# Patient Record
Sex: Female | Born: 2000 | Race: White | Hispanic: No | Marital: Single | Smoking: Never smoker
Health system: Southern US, Community
[De-identification: ages and names within clinical notes are randomized; demographics above are authoritative.]

---
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
## 2022-10-14 IMAGING — CT CT ABD-PELV W/ CM
2 of 4 series · 16 of 46 positions shown, 18 images · IV contrast (OMNIPAQUE 350)
Comparison: None.

CLINICAL DATA: Abdominal pain, fever. Concern for appendicitis
versus pyelonephritis versus renal stone.

EXAM:
CT ABDOMEN AND PELVIS WITH CONTRAST
TECHNIQUE: Multidetector CT imaging of the abdomen and pelvis was performed
using the standard protocol following bolus administration of
intravenous contrast.
CONTRAST:  75mL OMNIPAQUE IOHEXOL 350 MG/ML SOLN

[Series 2: axial st · axial · 0.64mm/px · z∈[+1118,+1483]mm · 13 of 83 slices shown, 15 images]
[im 5/83  soft-tissue]
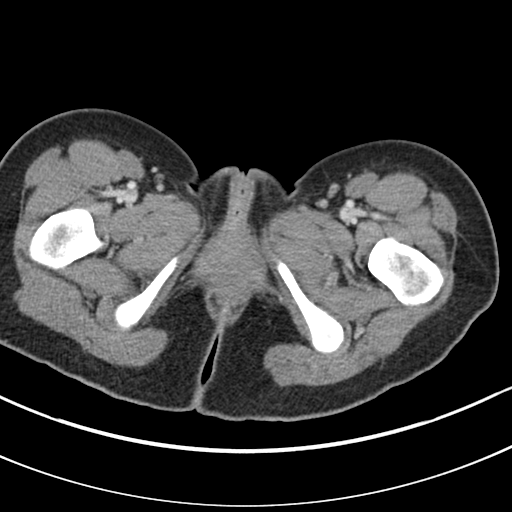
[im 5/83  bone]
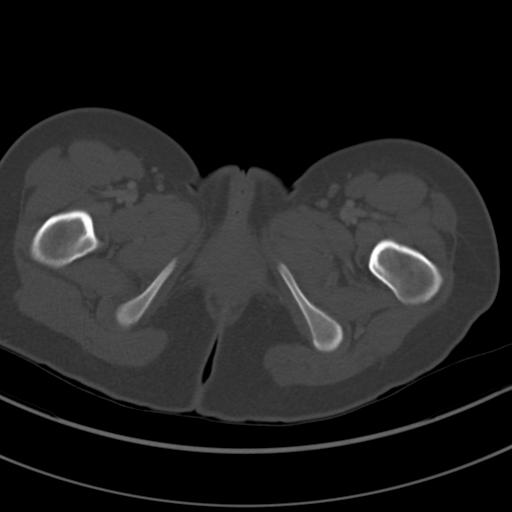
[im 13/83  soft-tissue]
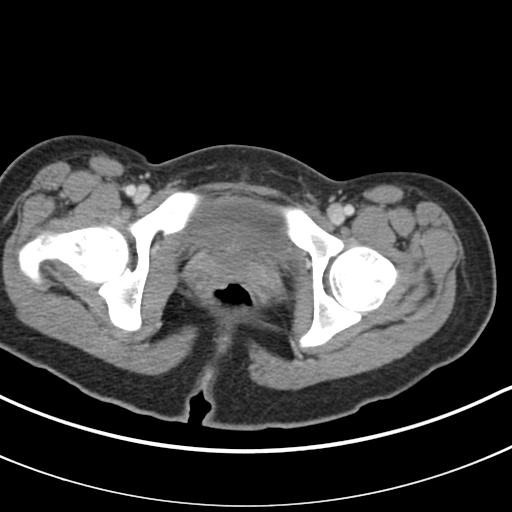
[im 18/83  soft-tissue]
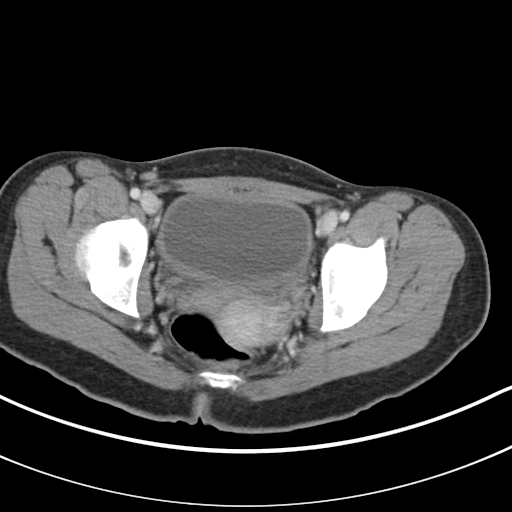
[im 22/83  soft-tissue]
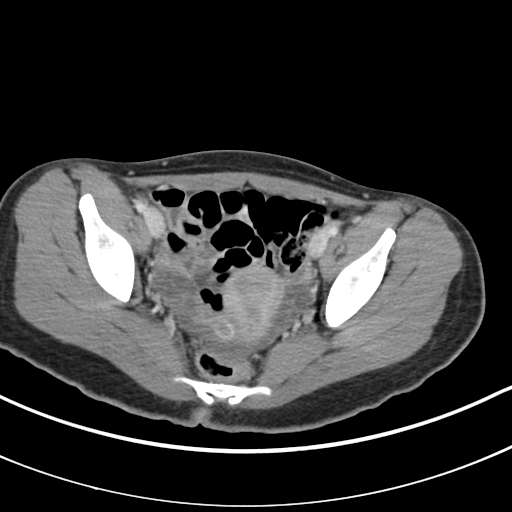
[im 31/83  soft-tissue]
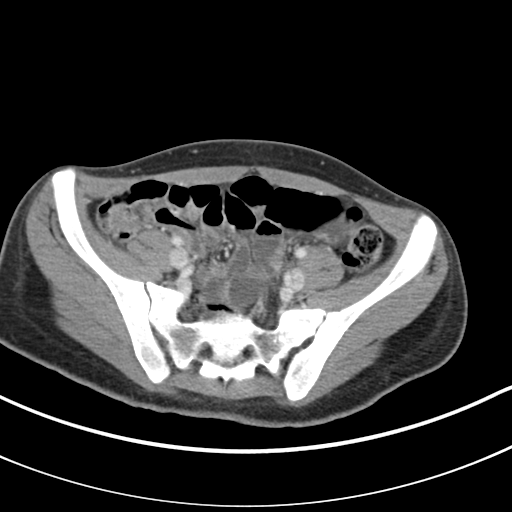
[im 35/83  soft-tissue]
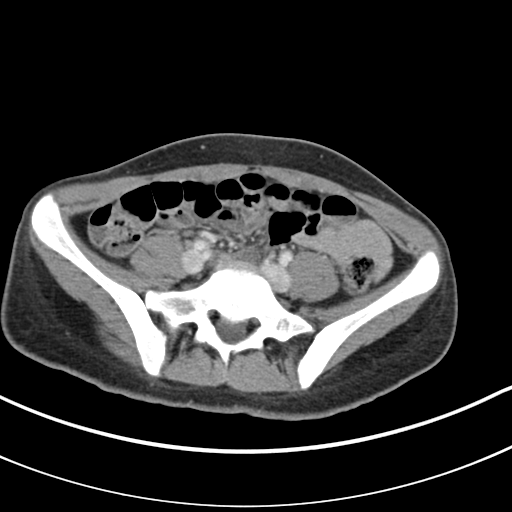
[im 44/83  soft-tissue]
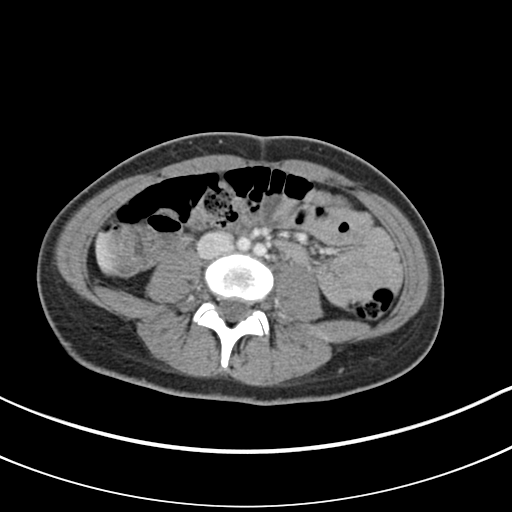
[im 48/83  soft-tissue]
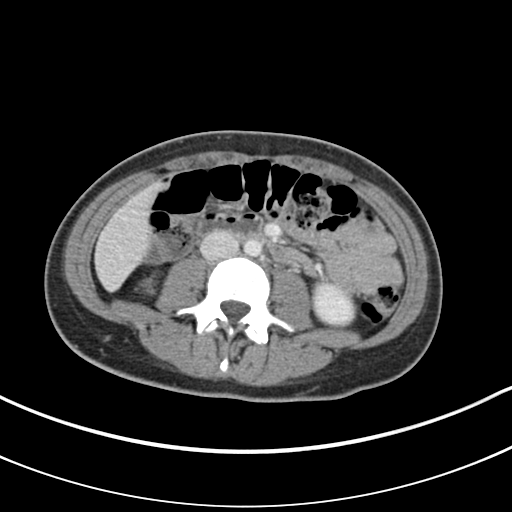
[im 52/83  soft-tissue]
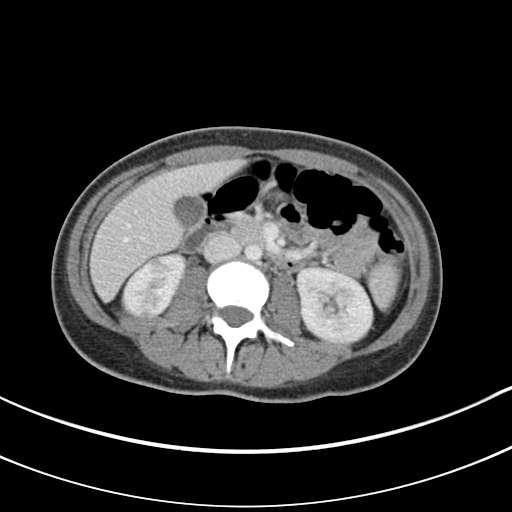
[im 52/83  bone]
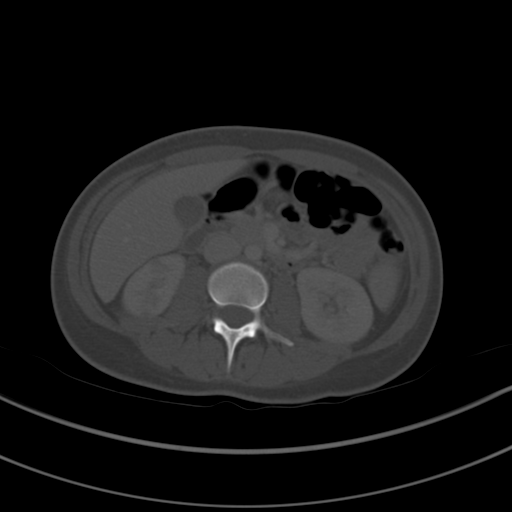
[im 61/83  soft-tissue]
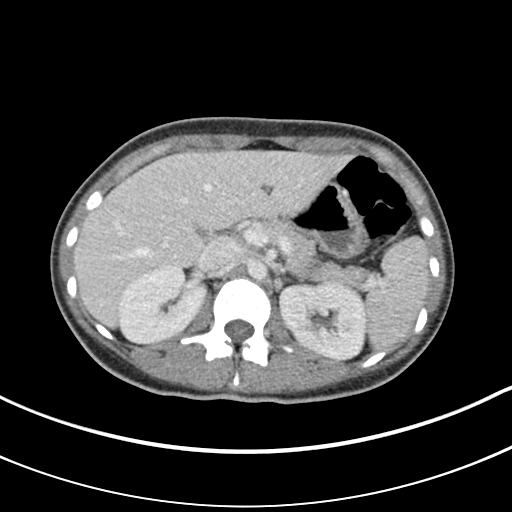
[im 65/83  soft-tissue]
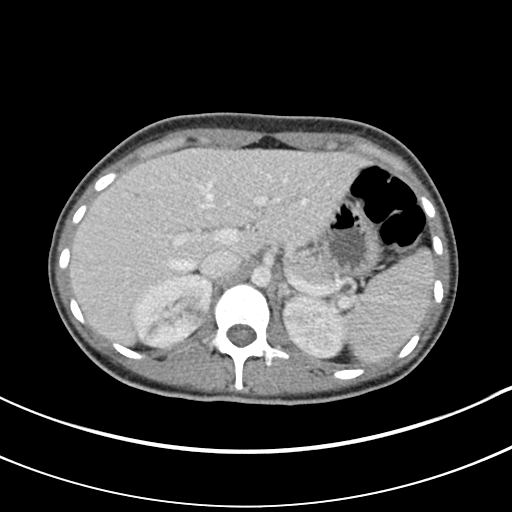
[im 70/83  soft-tissue]
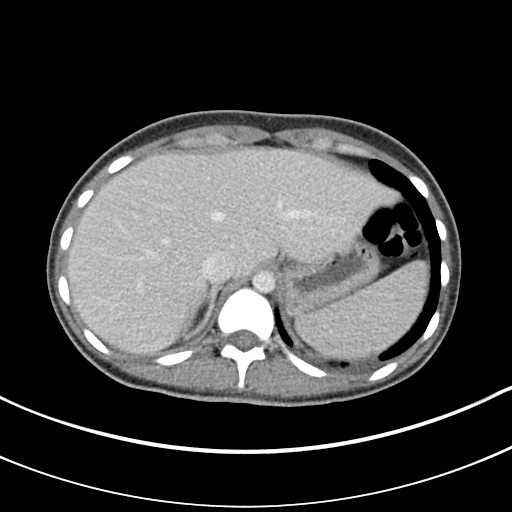
[im 78/83  soft-tissue]
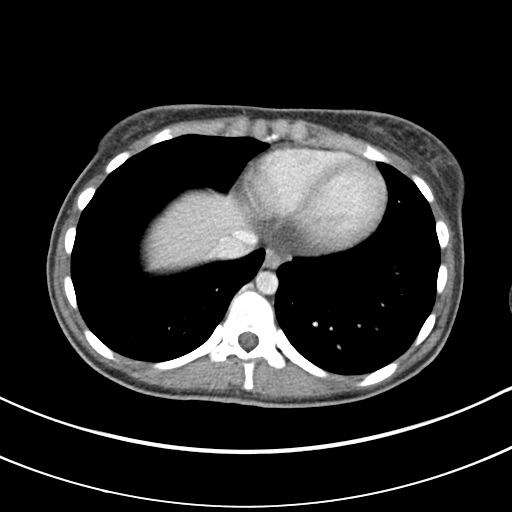

[Series 5: coronal st · coronal · 0.70mm/px · 3 of 91 slices shown]
[im 31/91  soft-tissue]
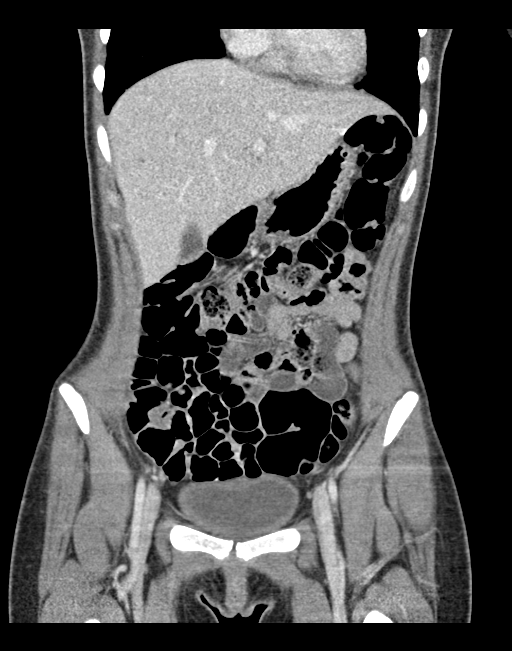
[im 41/91  soft-tissue]
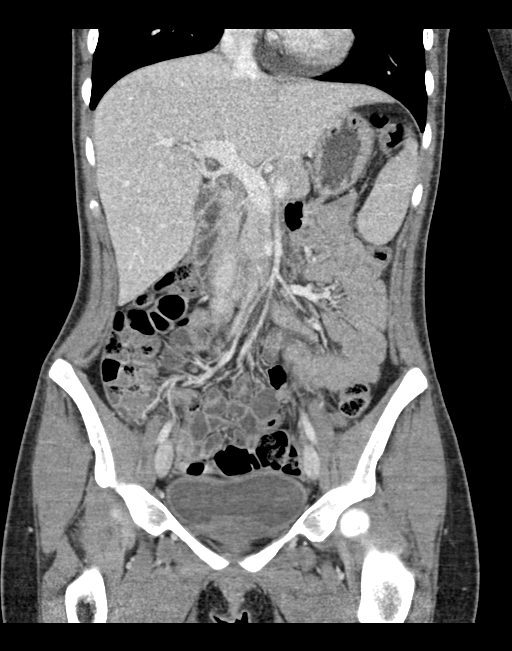
[im 51/91  soft-tissue]
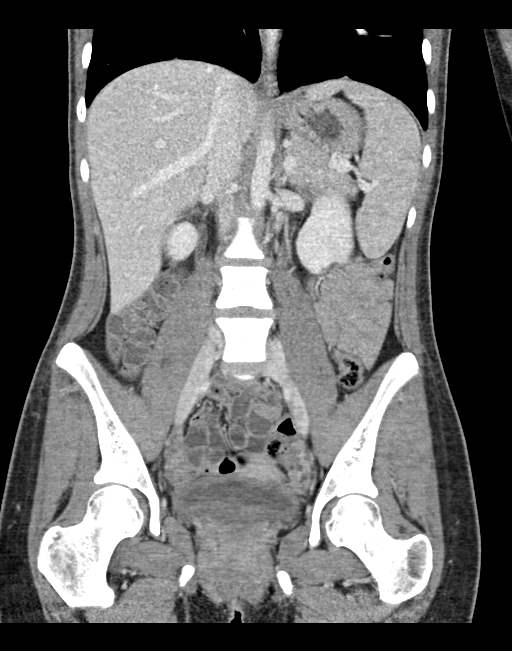

[16 of 46 positions shown; findings below may reference images not displayed]

FINDINGS: Lower chest: Bilateral lower lobe subsegmental atelectasis. No acute
abnormality.

Hepatobiliary: No focal liver abnormality. No gallstones,
gallbladder wall thickening, or pericholecystic fluid. No biliary
dilatation.

Pancreas: No focal lesion. Normal pancreatic contour. No surrounding
inflammatory changes. No main pancreatic ductal dilatation.

Spleen: Normal in size without focal abnormality.

Adrenals/Urinary Tract:

No adrenal nodule bilaterally.

Bilateral striated nephrograms.  No definite abscess formation.

No hydronephrosis. No hydroureter.  No nephroureterolithiasis.

Mild urinary bladder wall thickening which may be partially due to
under distension. Otherwise urinary bladder is unremarkable.

Stomach/Bowel: Stomach is within normal limits. No evidence of bowel
wall thickening or dilatation. Appendix appears normal.

Vascular/Lymphatic: No abdominal aorta or iliac aneurysm. No
abdominal, pelvic, or inguinal lymphadenopathy.

Reproductive: Uterus and bilateral adnexa are unremarkable.

Other: No intraperitoneal free fluid. No intraperitoneal free gas.
No organized fluid collection.

Musculoskeletal:

No abdominal wall hernia or abnormality.

No suspicious lytic or blastic osseous lesions. No acute displaced
fracture.
IMPRESSION: Bilateral pyelonephritis.
